# Patient Record
Sex: Male | Born: 1975 | Race: Black or African American | Hispanic: No | Marital: Married | Smoking: Current every day smoker
Health system: Southern US, Community
[De-identification: ages and names within clinical notes are randomized; demographics above are authoritative.]

## PROBLEM LIST (undated history)

## (undated) DIAGNOSIS — K219 Gastro-esophageal reflux disease without esophagitis: Secondary | ICD-10-CM

---

## 2015-05-29 ENCOUNTER — Emergency Department (HOSPITAL_COMMUNITY): Payer: Self-pay

## 2015-05-29 ENCOUNTER — Emergency Department (HOSPITAL_COMMUNITY)
Admission: EM | Admit: 2015-05-29 | Discharge: 2015-05-29 | Disposition: A | Discharge status: Home / Self Care | Payer: Self-pay | Attending: Emergency Medicine | Admitting: Emergency Medicine

## 2015-05-29 ENCOUNTER — Encounter (HOSPITAL_COMMUNITY): Payer: Self-pay | Admitting: Emergency Medicine

## 2015-05-29 DIAGNOSIS — J209 Acute bronchitis, unspecified: Secondary | ICD-10-CM | POA: Insufficient documentation

## 2015-05-29 DIAGNOSIS — R197 Diarrhea, unspecified: Secondary | ICD-10-CM | POA: Insufficient documentation

## 2015-05-29 DIAGNOSIS — J069 Acute upper respiratory infection, unspecified: Secondary | ICD-10-CM | POA: Insufficient documentation

## 2015-05-29 DIAGNOSIS — K219 Gastro-esophageal reflux disease without esophagitis: Secondary | ICD-10-CM | POA: Insufficient documentation

## 2015-05-29 DIAGNOSIS — Z79899 Other long term (current) drug therapy: Secondary | ICD-10-CM | POA: Insufficient documentation

## 2015-05-29 DIAGNOSIS — J0100 Acute maxillary sinusitis, unspecified: Secondary | ICD-10-CM | POA: Insufficient documentation

## 2015-05-29 DIAGNOSIS — F17200 Nicotine dependence, unspecified, uncomplicated: Secondary | ICD-10-CM | POA: Insufficient documentation

## 2015-05-29 HISTORY — DX: Gastro-esophageal reflux disease without esophagitis: K21.9

## 2015-05-29 MED ORDER — AZITHROMYCIN 250 MG PO TABS: 250.0000 mg | ORAL_TABLET | Freq: Every day | ORAL | Status: DC

## 2015-05-29 MED ORDER — PANTOPRAZOLE SODIUM 20 MG PO TBEC: 20.0000 mg | DELAYED_RELEASE_TABLET | Freq: Every day | ORAL | Status: DC

## 2015-05-29 MED ORDER — ALBUTEROL SULFATE HFA 108 (90 BASE) MCG/ACT IN AERS: 1.0000 | INHALATION_SPRAY | Freq: Four times a day (QID) | RESPIRATORY_TRACT | Status: DC | PRN

## 2015-05-29 MED ORDER — PREDNISONE 20 MG PO TABS
60.0000 mg | ORAL_TABLET | Freq: Once | ORAL | Status: AC
  Administered 2015-05-29: 60 mg via ORAL
  Filled 2015-05-29: qty 3

## 2015-05-29 MED ORDER — IPRATROPIUM-ALBUTEROL 0.5-2.5 (3) MG/3ML IN SOLN
3.0000 mL | Freq: Once | RESPIRATORY_TRACT | Status: AC
Start: 2015-05-29 — End: 2015-05-29
  Administered 2015-05-29: 3 mL via RESPIRATORY_TRACT
  Filled 2015-05-29: qty 3

## 2015-05-29 MED ORDER — GUAIFENESIN-CODEINE 100-10 MG/5ML PO SOLN: 5.0000 mL | Freq: Three times a day (TID) | ORAL | Status: DC | PRN

## 2015-05-29 MED ORDER — PREDNISONE 20 MG PO TABS: 40.0000 mg | ORAL_TABLET | Freq: Every day | ORAL | Status: DC

## 2015-05-29 MED ORDER — BENZONATATE 100 MG PO CAPS
200.0000 mg | ORAL_CAPSULE | Freq: Once | ORAL | Status: AC
  Administered 2015-05-29: 200 mg via ORAL
  Filled 2015-05-29: qty 2

## 2015-05-29 MED ORDER — ALBUTEROL SULFATE HFA 108 (90 BASE) MCG/ACT IN AERS
2.0000 | INHALATION_SPRAY | Freq: Once | RESPIRATORY_TRACT | Status: AC
  Administered 2015-05-29: 2 via RESPIRATORY_TRACT
  Filled 2015-05-29: qty 6.7

## 2015-05-29 NOTE — ED Provider Notes (Signed)
CSN: [id number]   Arrival date & time 05/29/15 1716  History  By signing my name below, I, Devin Harris, attest that this documentation has been prepared under the direction and in the presence of Devin Berry PA-C Electronically Signed: Bethel Harris, ED Scribe. 05/29/2015. 6:29 PM. Chief Complaint  Patient presents with  . Cough  . Nasal Congestion    HPI The history is provided by the patient. No language interpreter was used.   Devin Harris is a 39 y.o. male with history of acid reflux who presents to the Emergency Department complaining of cough productive of green sputum with onset 2 days ago. Associated symptoms include nasal congestion, post-nasal drip, right ear pain, 6/10 in severity chest pain that feels simultaneously pleuritic and like heartburn, SOB, occasional wheezing, chills, night sweats, subjective fever, diarrhea, and fatigue. He states that apart from the cough his symptoms have improved since initial onset. Pt denies abdominal pain. He has been exposed to sick children.  Pt states that he is a smoker but has been unable to smoke over the last 3 days. He typically smokes 4-5 cigarettes per day.   Past Medical History  Diagnosis Date  . Acid reflux     History reviewed. No pertinent past surgical history.  History reviewed. No pertinent family history.  Social History  Substance Use Topics  . Smoking status: Current Every Day Smoker  . Smokeless tobacco: None  . Alcohol Use: Yes     Review of Systems  Constitutional: Positive for fever (subjective), chills, diaphoresis (at night) and fatigue. Negative for activity change and appetite change.  HENT: Positive for congestion, postnasal drip and sore throat. Negative for facial swelling, mouth sores and rhinorrhea. Ear pain: ear pain last week, improved.   Eyes: Negative.   Respiratory: Positive for cough and shortness of breath (pt denies SOB, but family member at bedside reports SOB). Negative for choking,  chest tightness and wheezing.   Cardiovascular: Positive for chest pain.  Gastrointestinal: Positive for diarrhea. Negative for nausea, vomiting and abdominal pain.  Musculoskeletal: Negative.   Skin: Negative.  Negative for rash.  All other systems reviewed and are negative.  Home Medications   Prior to Admission medications   Medication Sig Start Date End Date Taking? Authorizing Provider  albuterol (PROVENTIL HFA;VENTOLIN HFA) 108 (90 BASE) MCG/ACT inhaler Inhale 1-2 puffs into the lungs every 6 (six) hours as needed for wheezing or shortness of breath. 05/29/15   Devin Berry, PA-C  azithromycin (ZITHROMAX) 250 MG tablet Take 1 tablet (250 mg total) by mouth daily. Take first 2 tablets together, then 1 every day until finished. 05/29/15   Devin Berry, PA-C  guaiFENesin-codeine 100-10 MG/5ML syrup Take 5 mLs by mouth 3 (three) times daily as needed for cough. 05/29/15   Devin Berry, PA-C  pantoprazole (PROTONIX) 20 MG tablet Take 1 tablet (20 mg total) by mouth daily. 05/29/15   Devin Berry, PA-C  predniSONE (DELTASONE) 20 MG tablet Take 2 tablets (40 mg total) by mouth daily. 05/29/15   Devin Berry, PA-C    Allergies  Review of patient's allergies indicates no known allergies.  Triage Vitals: BP 144/103 mmHg  Pulse 78  Temp(Src) 98.2 F (36.8 C) (Oral)  Resp 18  SpO2 98%  Physical Exam  Constitutional: He is oriented to person, place, and time. He appears well-developed and well-nourished. No distress.  HENT:  Head: Normocephalic and atraumatic.  Right Ear: External ear normal.  Left Ear: External ear normal.  Mouth/Throat: Oropharynx is clear  and moist. No oropharyngeal exudate.  Bilateral TM's normal ttp of bilateral maxillary sinus, no ttp of frontal sinus Nasal mucosa erythematous, with clear discharge  Eyes: Conjunctivae and EOM are normal. Pupils are equal, round, and reactive to light. Right eye exhibits no discharge. Left eye exhibits no discharge. No scleral icterus.   Large nodule on left upper eyelid, no erythema  Neck: Normal range of motion. Neck supple. No JVD present. No tracheal deviation present. No thyromegaly present.  Cardiovascular: Normal rate, regular rhythm, normal heart sounds and intact distal pulses.  Exam reveals no gallop and no friction rub.   No murmur heard. Pulmonary/Chest: Effort normal and breath sounds normal. No stridor. No respiratory distress. He has no wheezes. He has no rales. He exhibits no tenderness.  CTA with A&P auscultation, frequent cough, no wheeze, rhonchi or rales, no chest wall ttp No clubbing, no cyanosis, brisk capillary refill  Abdominal: Soft. Bowel sounds are normal. He exhibits no distension and no mass. There is no tenderness. There is no rebound and no guarding.  Musculoskeletal: Normal range of motion. He exhibits no edema or tenderness.  4 cm circular nodule on left dorsal wrist, over radial aspect, moves with flexion/extension of wrist, non-tender, no erythema, no fluctuance  Lymphadenopathy:    He has no cervical adenopathy.  Neurological: He is alert and oriented to person, place, and time. He has normal reflexes. No cranial nerve deficit. He exhibits normal muscle tone. Coordination normal.  Skin: Skin is warm and dry. No rash noted. He is not diaphoretic. No erythema. No pallor.  Psychiatric: He has a normal mood and affect. His behavior is normal. Judgment and thought content normal.  Nursing note and vitals reviewed.   ED Course  Procedures  DIAGNOSTIC STUDIES: Oxygen Saturation is 98% on RA,  normal by my interpretation.    COORDINATION OF CARE: 5:57 PM Discussed treatment plan which includes CXR and a breathing treatment with pt at bedside and pt agreed to the plan.  Labs Review- Labs Reviewed - No data to display  Imaging Review Dg Chest 2 View  05/29/2015  CLINICAL DATA:  39 year old male with cough and chest pain. Shortness of breath x4 days. EXAM: CHEST  2 VIEW COMPARISON:  None.  FINDINGS: The heart size and mediastinal contours are within normal limits. Both lungs are clear. The visualized skeletal structures are unremarkable. IMPRESSION: No active cardiopulmonary disease. Electronically Signed   By: Elgie Collard M.D.   On: 05/29/2015 18:18    MDM   Pt with cough with productive sputum, central CP with deep inspiration and pt feels related to GERD, he also had ear pain and diarrhea, which has improved, has sinus pressure/pain.  Pt's CP non-concerning for ACS.  Pt is PERC negative, do not suspect PE.  Pt given breathing tx in ER, with steroid and tessalon pearls, pt noted mild improvement in breathing and chest discomfort. CXR obtained-  Pt CXR negative for acute infiltrate.   Patients symptoms are consistent with URI, with rhinosinusitis and bronchitis. Will treat with azithromycin due to smoking history, reports of subjective fever, sweats and chills. He also be discharged with steroids and albuterol inhaler.  He requested acid reflux medication, given Protonix but also instructed that he may get over-the-counter Prilosec.  Pt is hemodynamically stable & in NAD prior to dc.  At presentation he was mildly hypertensive, but has improved at the time of discharge 122/80.   Final diagnoses:  URI (upper respiratory infection)  Acute bronchitis, unspecified organism  Gastroesophageal reflux disease, esophagitis presence not specified  Acute maxillary sinusitis, recurrence not specified   I personally performed the services described in this documentation, which was scribed in my presence. The recorded information has been reviewed and is accurate.        Devin BerryLeisa Jolanta Cabeza, PA-C 05/31/15 [id number]  Lorre NickAnthony Allen, MD 06/12/15 [phone]

## 2015-05-29 NOTE — ED Notes (Signed)
Pt transported to xray 

## 2015-05-29 NOTE — Discharge Instructions (Signed)
Acute Bronchitis Bronchitis is inflammation of the airways that extend from the windpipe into the lungs (bronchi). The inflammation often causes mucus to develop. This leads to a cough, which is the most common symptom of bronchitis.  In acute bronchitis, the condition usually develops suddenly and goes away over time, usually in a couple weeks. Smoking, allergies, and asthma can make bronchitis worse. Repeated episodes of bronchitis may cause further lung problems.  CAUSES Acute bronchitis is most often caused by the same virus that causes a cold. The virus can spread from person to person (contagious) through coughing, sneezing, and touching contaminated objects. SIGNS AND SYMPTOMS   Cough.   Fever.   Coughing up mucus.   Body aches.   Chest congestion.   Chills.   Shortness of breath.   Sore throat.  DIAGNOSIS  Acute bronchitis is usually diagnosed through a physical exam. Your health care provider will also ask you questions about your medical history. Tests, such as chest X-rays, are sometimes done to rule out other conditions.  TREATMENT  Acute bronchitis usually goes away in a couple weeks. Oftentimes, no medical treatment is necessary. Medicines are sometimes given for relief of fever or cough. Antibiotic medicines are usually not needed but may be prescribed in certain situations. In some cases, an inhaler may be recommended to help reduce shortness of breath and control the cough. A cool mist vaporizer may also be used to help thin bronchial secretions and make it easier to clear the chest.  HOME CARE INSTRUCTIONS  Get plenty of rest.   Drink enough fluids to keep your urine clear or pale yellow (unless you have a medical condition that requires fluid restriction). Increasing fluids may help thin your respiratory secretions (sputum) and reduce chest congestion, and it will prevent dehydration.   Take medicines only as directed by your health care provider.  If  you were prescribed an antibiotic medicine, finish it all even if you start to feel better.  Avoid smoking and secondhand smoke. Exposure to cigarette smoke or irritating chemicals will make bronchitis worse. If you are a smoker, consider using nicotine gum or skin patches to help control withdrawal symptoms. Quitting smoking will help your lungs heal faster.   Reduce the chances of another bout of acute bronchitis by washing your hands frequently, avoiding people with cold symptoms, and trying not to touch your hands to your mouth, nose, or eyes.   Keep all follow-up visits as directed by your health care provider.  SEEK MEDICAL CARE IF: Your symptoms do not improve after 1 week of treatment.  SEEK IMMEDIATE MEDICAL CARE IF:  You develop an increased fever or chills.   You have chest pain.   You have severe shortness of breath.  You have bloody sputum.   You develop dehydration.  You faint or repeatedly feel like you are going to pass out.  You develop repeated vomiting.  You develop a severe headache. MAKE SURE YOU:   Understand these instructions.  Will watch your condition.  Will get help right away if you are not doing well or get worse.   This information is not intended to replace advice given to you by your health care provider. Make sure you discuss any questions you have with your health care provider.   Document Released: 07/02/2004 Document Revised: 06/15/2014 Document Reviewed: 11/15/2012 Elsevier Interactive Patient Education 2016 [employer] Inc[employer]  Gastroesophageal Reflux Disease, Adult Normally, food travels down the esophagus and stays in the stomach to be digested.  However, when a person has gastroesophageal reflux disease (GERD), food and stomach acid move back up into the esophagus. When this happens, the esophagus becomes sore and inflamed. Over time, GERD can create small holes (ulcers) in the lining of the esophagus.  CAUSES This condition is caused  by a problem with the muscle between the esophagus and the stomach (lower esophageal sphincter, or LES). Normally, the LES muscle closes after food passes through the esophagus to the stomach. When the LES is weakened or abnormal, it does not close properly, and that allows food and stomach acid to go back up into the esophagus. The LES can be weakened by certain dietary substances, medicines, and medical conditions, including:  Tobacco use.  Pregnancy.  Having a hiatal hernia.  Heavy alcohol use.  Certain foods and beverages, such as coffee, chocolate, onions, and peppermint. RISK FACTORS This condition is more likely to develop in:  People who have an increased body weight.  People who have connective tissue disorders.  People who use NSAID medicines. SYMPTOMS Symptoms of this condition include:  Heartburn.  Difficult or painful swallowing.  The feeling of having a lump in the throat.  Abitter taste in the mouth.  Bad breath.  Having a large amount of saliva.  Having an upset or bloated stomach.  Belching.  Chest pain.  Shortness of breath or wheezing.  Ongoing (chronic) cough or a night-time cough.  Wearing away of tooth enamel.  Weight loss. Different conditions can cause chest pain. Make sure to see your health care provider if you experience chest pain. DIAGNOSIS Your health care provider will take a medical history and perform a physical exam. To determine if you have mild or severe GERD, your health care provider may also monitor how you respond to treatment. You may also have other tests, including:  An endoscopy toexamine your stomach and esophagus with a small camera.  A test thatmeasures the acidity level in your esophagus.  A test thatmeasures how much pressure is on your esophagus.  A barium swallow or modified barium swallow to show the shape, size, and functioning of your esophagus. TREATMENT The goal of treatment is to help relieve your  symptoms and to prevent complications. Treatment for this condition may vary depending on how severe your symptoms are. Your health care provider may recommend:  Changes to your diet.  Medicine.  Surgery. HOME CARE INSTRUCTIONS Diet  Follow a diet as recommended by your health care provider. This may involve avoiding foods and drinks such as:  Coffee and tea (with or without caffeine).  Drinks that containalcohol.  Energy drinks and sports drinks.  Carbonated drinks or sodas.  Chocolate and cocoa.  Peppermint and mint flavorings.  Garlic and onions.  Horseradish.  Spicy and acidic foods, including peppers, chili powder, curry powder, vinegar, hot sauces, and barbecue sauce.  Citrus fruit juices and citrus fruits, such as oranges, lemons, and limes.  Tomato-based foods, such as red sauce, chili, salsa, and pizza with red sauce.  Fried and fatty foods, such as donuts, french fries, potato chips, and high-fat dressings.  High-fat meats, such as hot dogs and fatty cuts of red and white meats, such as rib eye steak, sausage, ham, and bacon.  High-fat dairy items, such as whole milk, butter, and cream cheese.  Eat small, frequent meals instead of large meals.  Avoid drinking large amounts of liquid with your meals.  Avoid eating meals during the 2-3 hours before bedtime.  Avoid lying down right after  you eat.  Do not exercise right after you eat. General Instructions  Pay attention to any changes in your symptoms.  Take over-the-counter and prescription medicines only as told by your health care provider. Do not take aspirin, ibuprofen, or other NSAIDs unless your health care provider told you to do so.  Do not use any tobacco products, including cigarettes, chewing tobacco, and e-cigarettes. If you need help quitting, ask your health care provider.  Wear loose-fitting clothing. Do not wear anything tight around your waist that causes pressure on your  abdomen.  Raise (elevate) the head of your bed 6 inches (15cm).  Try to reduce your stress, such as with yoga or meditation. If you need help reducing stress, ask your health care provider.  If you are overweight, reduce your weight to an amount that is healthy for you. Ask your health care provider for guidance about a safe weight loss goal.  Keep all follow-up visits as told by your health care provider. This is important. SEEK MEDICAL CARE IF:  You have new symptoms.  You have unexplained weight loss.  You have difficulty swallowing, or it hurts to swallow.  You have wheezing or a persistent cough.  Your symptoms do not improve with treatment.  You have a hoarse voice. SEEK IMMEDIATE MEDICAL CARE IF:  You have pain in your arms, neck, jaw, teeth, or back.  You feel sweaty, dizzy, or light-headed.  You have chest pain or shortness of breath.  You vomit and your vomit looks like blood or coffee grounds.  You faint.  Your stool is bloody or black.  You cannot swallow, drink, or eat.   This information is not intended to replace advice given to you by your health care provider. Make sure you discuss any questions you have with your health care provider.   Document Released: 03/04/2005 Document Revised: 02/13/2015 Document Reviewed: 09/19/2014 Elsevier Interactive Patient Education 2016 Else[employer] Inc.  Sinusitis, Adult Sinusitis is redness, soreness, and inflammation of the paranasal sinuses. Paranasal sinuses are air pockets within the bones of your face. They are located beneath your eyes, in the middle of your forehead, and above your eyes. In healthy paranasal sinuses, mucus is able to drain out, and air is able to circulate through them by way of your nose. However, when your paranasal sinuses are inflamed, mucus and air can become trapped. This can allow bacteria and other germs to grow and cause infection. Sinusitis can develop quickly and last only a short time  (acute) or continue over a long period (chronic). Sinusitis that lasts for more than 12 weeks is considered chronic. CAUSES Causes of sinusitis include:  Allergies.  Structural abnormalities, such as displacement of the cartilage that separates your nostrils (deviated septum), which can decrease the air flow through your nose and sinuses and affect sinus drainage.  Functional abnormalities, such as when the small hairs (cilia) that line your sinuses and help remove mucus do not work properly or are not present. SIGNS AND SYMPTOMS Symptoms of acute and chronic sinusitis are the same. The primary symptoms are pain and pressure around the affected sinuses. Other symptoms include:  Upper toothache.  Earache.  Headache.  Bad breath.  Decreased sense of smell and taste.  A cough, which worsens when you are lying flat.  Fatigue.  Fever.  Thick drainage from your nose, which often is green and may contain pus (purulent).  Swelling and warmth over the affected sinuses. DIAGNOSIS Your health care provider will perform a  physical exam. During your exam, your health care provider may perform any of the following to help determine if you have acute sinusitis or chronic sinusitis:  Look in your nose for signs of abnormal growths in your nostrils (nasal polyps).  Tap over the affected sinus to check for signs of infection.  View the inside of your sinuses using an imaging device that has a light attached (endoscope). If your health care provider suspects that you have chronic sinusitis, one or more of the following tests may be recommended:  Allergy tests.  Nasal culture. A sample of mucus is taken from your nose, sent to a lab, and screened for bacteria.  Nasal cytology. A sample of mucus is taken from your nose and examined by your health care provider to determine if your sinusitis is related to an allergy. TREATMENT Most cases of acute sinusitis are related to a viral infection  and will resolve on their own within 10 days. Sometimes, medicines are prescribed to help relieve symptoms of both acute and chronic sinusitis. These may include pain medicines, decongestants, nasal steroid sprays, or saline sprays. However, for sinusitis related to a bacterial infection, your health care provider will prescribe antibiotic medicines. These are medicines that will help kill the bacteria causing the infection. Rarely, sinusitis is caused by a fungal infection. In these cases, your health care provider will prescribe antifungal medicine. For some cases of chronic sinusitis, surgery is needed. Generally, these are cases in which sinusitis recurs more than 3 times per year, despite other treatments. HOME CARE INSTRUCTIONS  Drink plenty of water. Water helps thin the mucus so your sinuses can drain more easily.  Use a humidifier.  Inhale steam 3-4 times a day (for example, sit in the bathroom with the shower running).  Apply a warm, moist washcloth to your face 3-4 times a day, or as directed by your health care provider.  Use saline nasal sprays to help moisten and clean your sinuses.  Take medicines only as directed by your health care provider.  If you were prescribed either an antibiotic or antifungal medicine, finish it all even if you start to feel better. SEEK IMMEDIATE MEDICAL CARE IF:  You have increasing pain or severe headaches.  You have nausea, vomiting, or drowsiness.  You have swelling around your face.  You have vision problems.  You have a stiff neck.  You have difficulty breathing.   This information is not intended to replace advice given to you by your health care provider. Make sure you discuss any questions you have with your health care provider.   Document Released: 05/25/2005 Document Revised: 06/15/2014 Document Reviewed: 06/09/2011 Elsevier Interactive Patient Education 2016 Else[employer] Inc.  Upper Respiratory Infection, Adult Most upper  respiratory infections (URIs) are caused by a virus. A URI affects the nose, throat, and upper air passages. The most common type of URI is often called "the common cold." HOME CARE   Take medicines only as told by your doctor.  Gargle warm saltwater or take cough drops to comfort your throat as told by your doctor.  Use a warm mist humidifier or inhale steam from a shower to increase air moisture. This may make it easier to breathe.  Drink enough fluid to keep your pee (urine) clear or pale yellow.  Eat soups and other clear broths.  Have a healthy diet.  Rest as needed.  Go back to work when your fever is gone or your doctor says it is okay.  You  may need to stay home longer to avoid giving your URI to others.  You can also wear a face mask and wash your hands often to prevent spread of the virus.  Use your inhaler more if you have asthma.  Do not use any tobacco products, including cigarettes, chewing tobacco, or electronic cigarettes. If you need help quitting, ask your doctor. GET HELP IF:  You are getting worse, not better.  Your symptoms are not helped by medicine.  You have chills.  You are getting more short of breath.  You have brown or red mucus.  You have yellow or brown discharge from your nose.  You have pain in your face, especially when you bend forward.  You have a fever.  You have puffy (swollen) neck glands.  You have pain while swallowing.  You have white areas in the back of your throat. GET HELP RIGHT AWAY IF:   You have very bad or constant:  Headache.  Ear pain.  Pain in your forehead, behind your eyes, and over your cheekbones (sinus pain).  Chest pain.  You have long-lasting (chronic) lung disease and any of the following:  Wheezing.  Long-lasting cough.  Coughing up blood.  A change in your usual mucus.  You have a stiff neck.  You have changes in your:  Vision.  Hearing.  Thinking.  Mood. MAKE SURE YOU:    Understand these instructions.  Will watch your condition.  Will get help right away if you are not doing well or get worse.   This information is not intended to replace advice given to you by your health care provider. Make sure you discuss any questions you have with your health care provider.   Document Released: 11/11/2007 Document Revised: 10/09/2014 Document Reviewed: 08/30/2013 Elsevier Interactive Patient Education [employer] Inc[employer]

## 2015-05-29 NOTE — ED Notes (Signed)
Pt sts cough and nasal congestion with some chills; pt sts some sore throat; pt sts right ear pain

## 2017-02-16 ENCOUNTER — Encounter (HOSPITAL_COMMUNITY): Payer: Self-pay

## 2017-02-16 ENCOUNTER — Emergency Department (HOSPITAL_COMMUNITY): Payer: 59

## 2017-02-16 DIAGNOSIS — Z79899 Other long term (current) drug therapy: Secondary | ICD-10-CM | POA: Diagnosis not present

## 2017-02-16 DIAGNOSIS — R079 Chest pain, unspecified: Secondary | ICD-10-CM | POA: Insufficient documentation

## 2017-02-16 DIAGNOSIS — F17210 Nicotine dependence, cigarettes, uncomplicated: Secondary | ICD-10-CM | POA: Diagnosis not present

## 2017-02-16 LAB — BASIC METABOLIC PANEL
ANION GAP: 7 (ref 5–15)
BUN: 16 mg/dL (ref 6–20)
CALCIUM: 8.7 mg/dL — AB (ref 8.9–10.3)
CO2: 24 mmol/L (ref 22–32)
Chloride: 107 mmol/L (ref 101–111)
Creatinine, Ser: 1.25 mg/dL — ABNORMAL HIGH (ref 0.61–1.24)
GFR calc non Af Amer: >60 mL/min (ref >60)
Glucose, Bld: 91 mg/dL (ref 65–99)
POTASSIUM: 4 mmol/L (ref 3.5–5.1)
Sodium: 138 mmol/L (ref 135–145)

## 2017-02-16 LAB — CBC
HEMATOCRIT: 42.1 % (ref 39.0–52.0)
HEMOGLOBIN: 13.8 g/dL (ref 13.0–17.0)
MCH: 26.5 pg (ref 26.0–34.0)
MCHC: 32.8 g/dL (ref 30.0–36.0)
MCV: 80.8 fL (ref 78.0–100.0)
Platelets: 245 10*3/uL (ref 150–400)
RBC: 5.21 MIL/uL (ref 4.22–5.81)
RDW: 14.5 % (ref 11.5–15.5)
WBC: 9.3 10*3/uL (ref 4.0–10.5)

## 2017-02-16 LAB — I-STAT TROPONIN, ED: TROPONIN I, POC: 0.01 ng/mL (ref 0.00–0.08)

## 2017-02-16 NOTE — ED Triage Notes (Signed)
Onset 1 week pain in mid chest radiating through to back and left arm.  Pt reports he has decreased Po fluid/food intake and cigarette smoking d/t discomfort in chest.

## 2017-02-17 ENCOUNTER — Emergency Department (HOSPITAL_COMMUNITY)
Admission: EM | Admit: 2017-02-17 | Discharge: 2017-02-17 | Disposition: A | Discharge status: Home / Self Care | Payer: 59 | Attending: Emergency Medicine | Admitting: Emergency Medicine

## 2017-02-17 DIAGNOSIS — R079 Chest pain, unspecified: Secondary | ICD-10-CM

## 2017-02-17 MED ORDER — OMEPRAZOLE 20 MG PO CPDR: 20.0000 mg | DELAYED_RELEASE_CAPSULE | Freq: Two times a day (BID) | ORAL | 0 refills | Status: AC

## 2017-02-17 MED ORDER — GI COCKTAIL ~~LOC~~
30.0000 mL | Freq: Once | ORAL | Status: AC
  Administered 2017-02-17: 30 mL via ORAL
  Filled 2017-02-17: qty 30

## 2017-02-17 NOTE — ED Provider Notes (Signed)
[facility]-EMERGENCY DEPT Provider Note   CSN: [id number] Arrival date & time: 02/16/17  1927     History   Chief Complaint Chief Complaint  Patient presents with  . Chest Pain    HPI Devin Harris is a 41 y.o. male.  This patient is a 41 year old male with no significant past history presenting for evaluation of chest discomfort. He reports that this is been present for the past several days. He describes a burning in his chest that is worse when he eats or drinks. He denies any shortness of breath, nausea, diaphoresis, or radiation to the arm or jaw. He denies any fevers or chills.   The history is provided by the patient.  Chest Pain   This is a new problem. Episode onset: Several days ago. The problem occurs constantly. The problem has been gradually worsening. The pain is present in the substernal region. The pain is moderate. The quality of the pain is described as burning. The pain does not radiate. Exacerbated by: Eating. Pertinent negatives include no cough, no dizziness, no fever, no palpitations and no shortness of breath. He has tried nothing for the symptoms.    Past Medical History:  Diagnosis Date  . Acid reflux     There are no active problems to display for this patient.   No past surgical history on file.     Home Medications    Prior to Admission medications   Medication Sig Start Date End Date Taking? Authorizing Provider  albuterol (PROVENTIL HFA;VENTOLIN HFA) 108 (90 BASE) MCG/ACT inhaler Inhale 1-2 puffs into the lungs every 6 (six) hours as needed for wheezing or shortness of breath. 05/29/15   Danelle Berryapia, Leisa, PA-C  azithromycin (ZITHROMAX) 250 MG tablet Take 1 tablet (250 mg total) by mouth daily. Take first 2 tablets together, then 1 every day until finished. 05/29/15   Danelle Berryapia, Leisa, PA-C  guaiFENesin-codeine 100-10 MG/5ML syrup Take 5 mLs by mouth 3 (three) times daily as needed for cough. 05/29/15   Danelle Berryapia, Leisa, PA-C  pantoprazole (PROTONIX) 20 MG  tablet Take 1 tablet (20 mg total) by mouth daily. 05/29/15   Danelle Berryapia, Leisa, PA-C  predniSONE (DELTASONE) 20 MG tablet Take 2 tablets (40 mg total) by mouth daily. 05/29/15   Danelle Berryapia, Leisa, PA-C    Family History No family history on file.  Social History Social History  Substance Use Topics  . Smoking status: Current Every Day Smoker    Packs/day: 0.50    Types: Cigarettes  . Smokeless tobacco: Never Used  . Alcohol use Yes     Allergies   Patient has no known allergies.   Review of Systems Review of Systems  Constitutional: Negative for fever.  Respiratory: Negative for cough and shortness of breath.   Cardiovascular: Positive for chest pain. Negative for palpitations.  Neurological: Negative for dizziness.  All other systems reviewed and are negative.    Physical Exam Updated Vital Signs BP (!) 104/53   Pulse 68   Temp 97.9 F (36.6 C) (Oral)   Resp 16   Ht 6\' 4"  (1.93 m)   Wt 118.7 kg (261 lb 11.2 oz)   SpO2 100%   BMI 31.86 kg/m   Physical Exam  Constitutional: He is oriented to person, place, and time. He appears well-developed and well-nourished. No distress.  HENT:  Head: Normocephalic and atraumatic.  Mouth/Throat: Oropharynx is clear and moist.  Neck: Normal range of motion. Neck supple.  Cardiovascular: Normal rate and regular rhythm.  Exam reveals no  friction rub.   No murmur heard. Pulmonary/Chest: Effort normal and breath sounds normal. No respiratory distress. He has no wheezes. He has no rales.  Abdominal: Soft. Bowel sounds are normal. He exhibits no distension. There is no tenderness.  Musculoskeletal: Normal range of motion. He exhibits no edema.  Neurological: He is alert and oriented to person, place, and time. Coordination normal.  Skin: Skin is warm and dry. He is not diaphoretic.  Nursing note and vitals reviewed.    ED Treatments / Results  Labs (all labs ordered are listed, but only abnormal results are displayed) Labs Reviewed    BASIC METABOLIC PANEL - Abnormal; Notable for the following:       Result Value   Creatinine, Ser 1.25 (*)    Calcium 8.7 (*)    All other components within normal limits  CBC  I-STAT TROPONIN, ED    EKG  EKG Interpretation  Date/Time:  Tuesday February 16 2017 19:31:11 EDT Ventricular Rate:  85 PR Interval:  172 QRS Duration: 110 QT Interval:  372 QTC Calculation: 442 R Axis:   35 Text Interpretation:  Normal sinus rhythm Normal ECG Confirmed by Geoffery Lyons ([id number]) on 02/17/2017 2:47:47 AM       Radiology Dg Chest 2 View  Result Date: 02/16/2017 CLINICAL DATA:  Mid chest tightness. Upper mid, right-sided neck and upper back, left arm tingling. Chest burns when swallowing certain foods. Symptoms off and on for 1 year, worse over the last couple of days. EXAM: CHEST  2 VIEW COMPARISON:  05/29/2015 FINDINGS: The heart size and mediastinal contours are within normal limits. Both lungs are clear. The visualized skeletal structures are unremarkable. IMPRESSION: No active cardiopulmonary disease. Electronically Signed   By: Norva Pavlov M.D.   On: 02/16/2017 20:13    Procedures Procedures (including critical care time)  Medications Ordered in ED Medications  gi cocktail (Maalox,Lidocaine,Donnatal) (30 mLs Oral Given 02/17/17 0328)     Initial Impression / Assessment and Plan / ED Course  I have reviewed the triage vital signs and the nursing notes.  Pertinent labs & imaging results that were available during my care of the patient were reviewed by me and considered in my medical decision making (see chart for details).  Patient presenting with chest pain that appears noncardiac in nature. EKG and troponin is negative and chest x-ray is clear. He reports discomfort with swallowing and I suspect his symptoms are likely related to reflux. He will be treated with Prilosec and follow-up as needed with GI.  Final Clinical Impressions(s) / ED Diagnoses   Final diagnoses:   None    New Prescriptions New Prescriptions   No medications on file     Geoffery Lyons, MD 02/17/17 [phone]

## 2017-02-17 NOTE — Discharge Instructions (Signed)
Omeprazole as prescribed.  Follow up with Gastroenterology if not improving in the next week. The contact information for [facility] has been provided in the discharge summary for you to call and make these arrangements.

## 2018-06-19 IMAGING — DX DG CHEST 2V
2 series · 2 of 2 positions shown · non-contrast
Comparison: 05/29/2015

CLINICAL DATA: Mid chest tightness. Upper mid, right-sided neck and
upper back, left arm tingling. Chest burns when swallowing certain
foods. Symptoms off and on for 1 year, worse over the last couple of
days.

EXAM:
CHEST  2 VIEW

[chest pa]
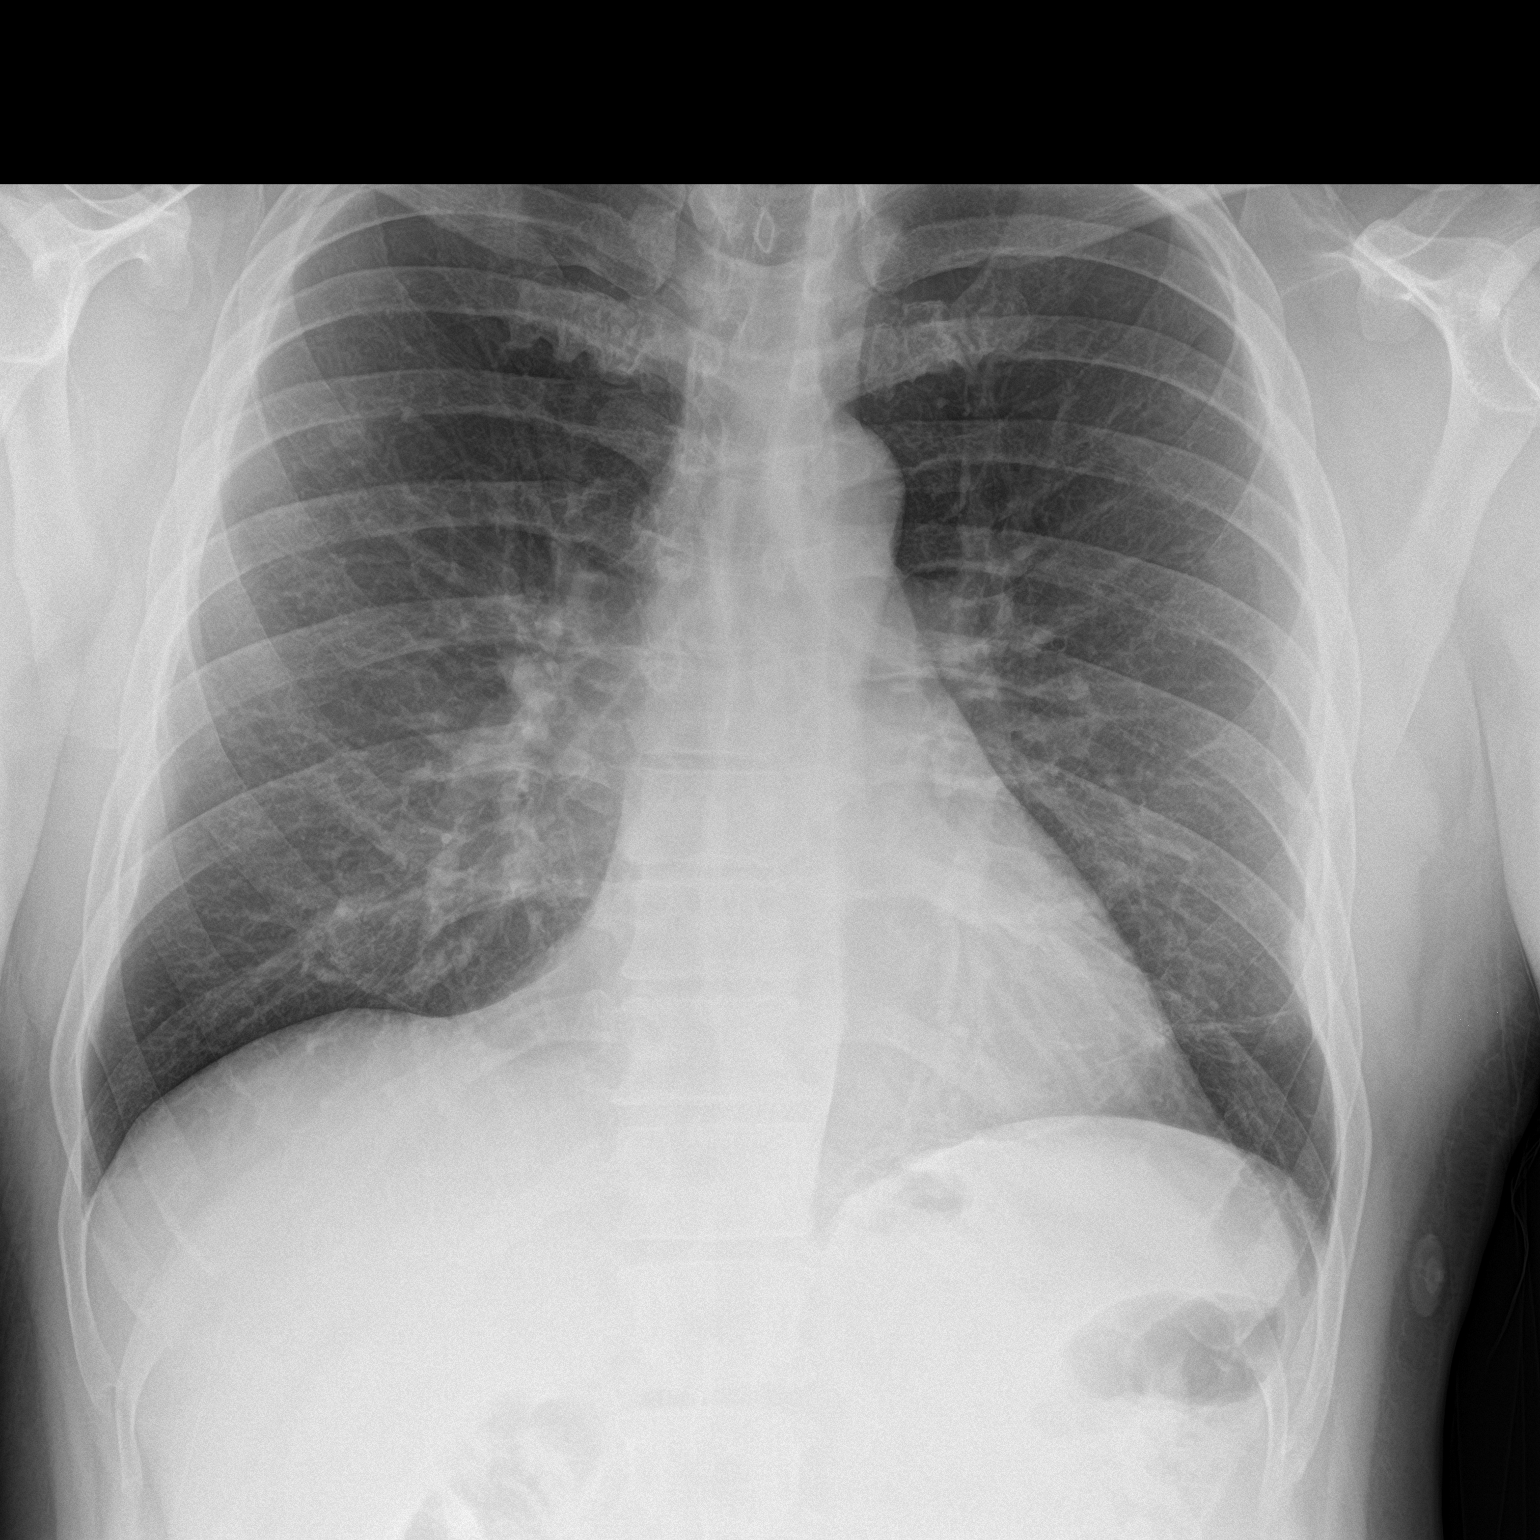

[chest lat]
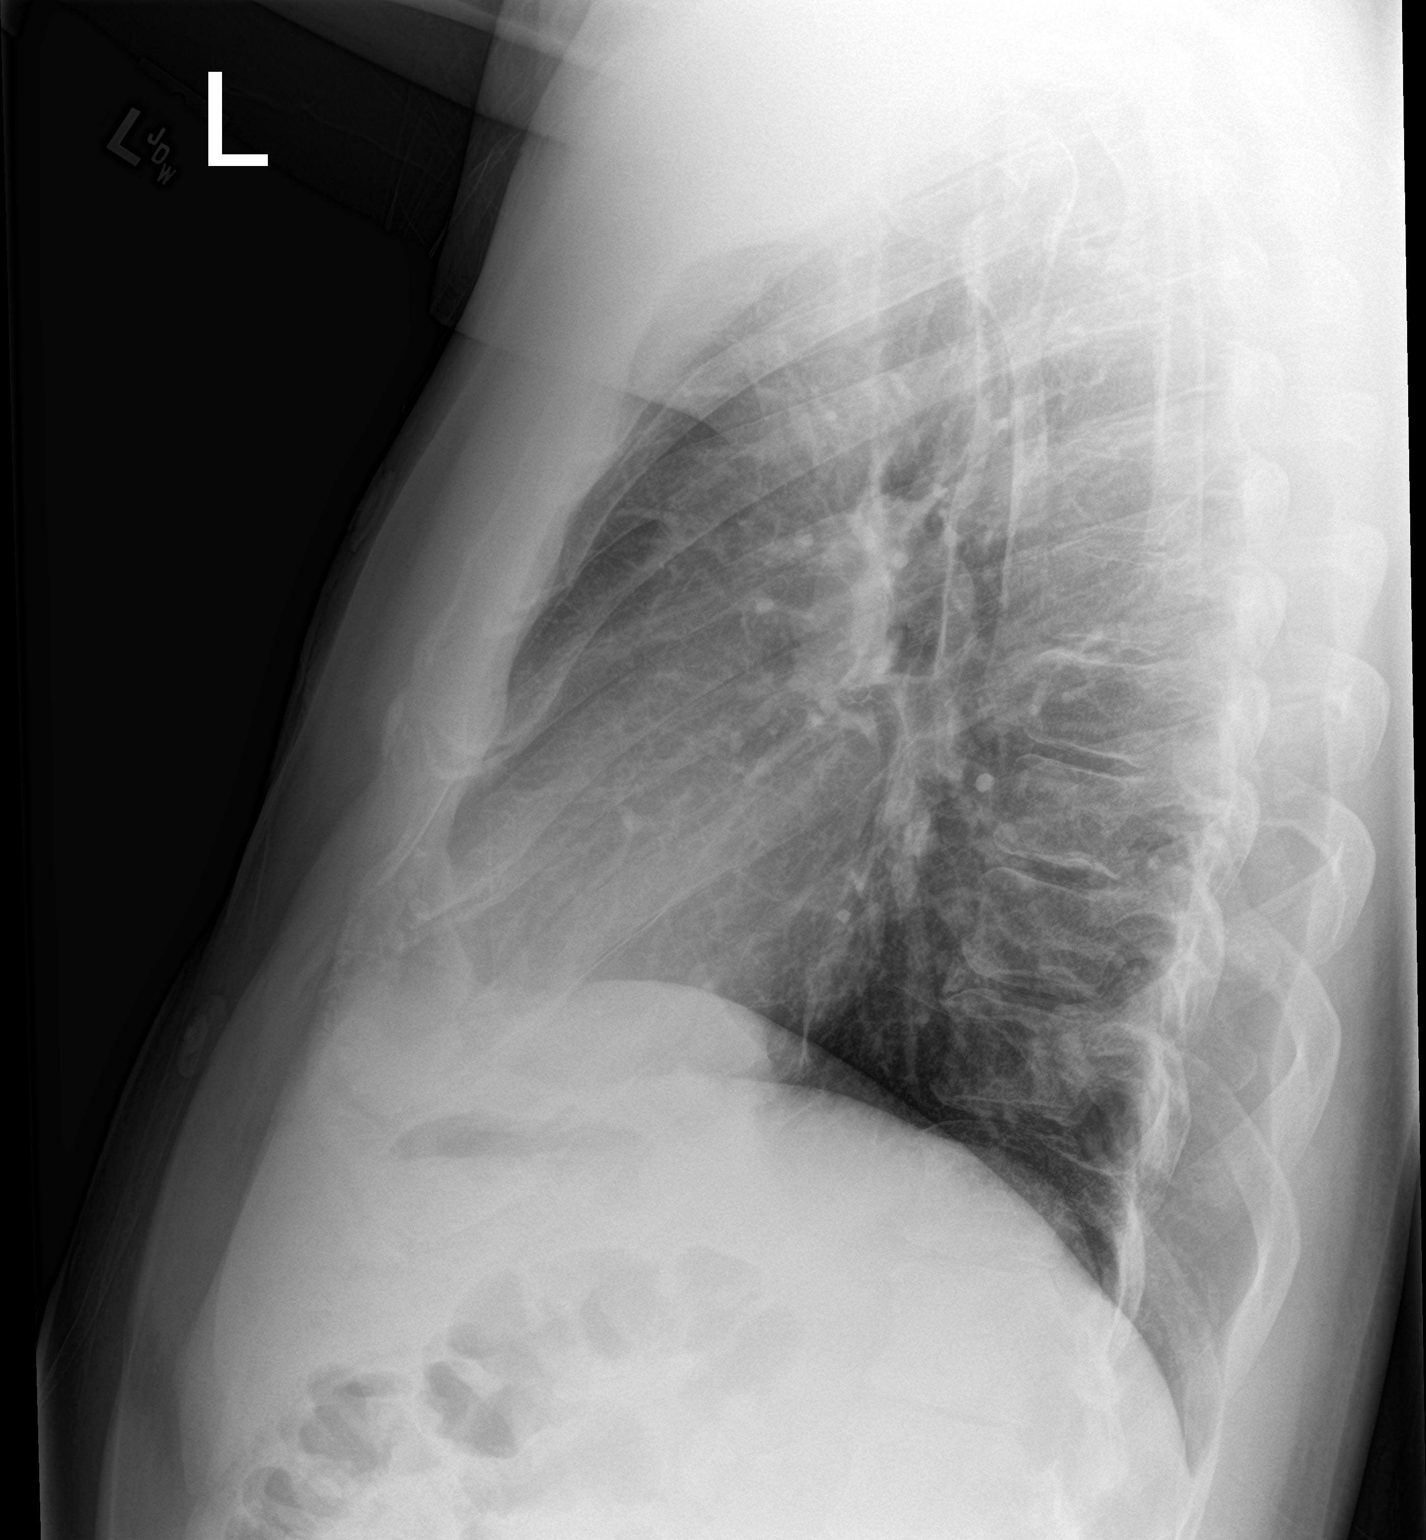

[2 of 2 positions shown; findings below may reference images not displayed]

FINDINGS: The heart size and mediastinal contours are within normal limits.
Both lungs are clear. The visualized skeletal structures are
unremarkable.
IMPRESSION: No active cardiopulmonary disease.

## 2021-05-26 ENCOUNTER — Ambulatory Visit: Payer: 59 | Admitting: Family Medicine

## 2021-07-29 ENCOUNTER — Ambulatory Visit: Payer: Medicaid Other | Admitting: Family Medicine
